# Patient Record
Sex: Male | Born: 1955 | Race: White | Hispanic: No | Marital: Married | Smoking: Never smoker
Health system: Southern US, Community
[De-identification: ages and names within clinical notes are randomized; demographics above are authoritative.]

## PROBLEM LIST (undated history)

## (undated) DIAGNOSIS — E785 Hyperlipidemia, unspecified: Secondary | ICD-10-CM

## (undated) DIAGNOSIS — K219 Gastro-esophageal reflux disease without esophagitis: Secondary | ICD-10-CM

## (undated) DIAGNOSIS — A048 Other specified bacterial intestinal infections: Secondary | ICD-10-CM

## (undated) DIAGNOSIS — J45909 Unspecified asthma, uncomplicated: Secondary | ICD-10-CM

## (undated) DIAGNOSIS — Z86018 Personal history of other benign neoplasm: Secondary | ICD-10-CM

## (undated) HISTORY — DX: Gastro-esophageal reflux disease without esophagitis: K21.9

## (undated) HISTORY — PX: SHOULDER SURGERY: SHX246

## (undated) HISTORY — PX: UMBILICAL HERNIA REPAIR: SHX196

## (undated) HISTORY — DX: Hyperlipidemia, unspecified: E78.5

## (undated) HISTORY — DX: Unspecified asthma, uncomplicated: J45.909

## (undated) HISTORY — DX: Other specified bacterial intestinal infections: A04.8

## (undated) HISTORY — PX: KNEE CARTILAGE SURGERY: SHX688

---
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
## 2002-11-15 ENCOUNTER — Ambulatory Visit (HOSPITAL_COMMUNITY): Admission: RE | Admit: 2002-11-15 | Discharge: 2002-11-15 | Payer: Self-pay | Admitting: Gastroenterology

## 2004-09-19 ENCOUNTER — Ambulatory Visit: Payer: Self-pay | Admitting: Specialist

## 2006-01-16 ENCOUNTER — Ambulatory Visit: Payer: Self-pay | Admitting: Specialist

## 2006-02-18 ENCOUNTER — Ambulatory Visit: Payer: Self-pay | Admitting: Specialist

## 2007-04-30 ENCOUNTER — Ambulatory Visit: Payer: Self-pay | Admitting: Surgery

## 2009-03-23 DIAGNOSIS — D239 Other benign neoplasm of skin, unspecified: Secondary | ICD-10-CM

## 2009-03-23 HISTORY — DX: Other benign neoplasm of skin, unspecified: D23.9

## 2014-05-17 ENCOUNTER — Emergency Department: Payer: Self-pay | Admitting: Emergency Medicine

## 2014-05-17 LAB — BASIC METABOLIC PANEL
Anion Gap: 9 (ref 7–16)
BUN: 20 mg/dL — AB (ref 7–18)
Calcium, Total: 8.5 mg/dL (ref 8.5–10.1)
Chloride: 107 mmol/L (ref 98–107)
Co2: 23 mmol/L (ref 21–32)
Creatinine: 0.98 mg/dL (ref 0.60–1.30)
EGFR (Non-African Amer.): >60
Glucose: 93 mg/dL (ref 65–99)
Osmolality: 280 (ref 275–301)
Potassium: 5 mmol/L (ref 3.5–5.1)
Sodium: 139 mmol/L (ref 136–145)

## 2014-05-17 LAB — TROPONIN I: Troponin-I: <0.02 ng/mL

## 2014-05-18 LAB — CBC
HCT: 42.3 % (ref 40.0–52.0)
HGB: 14.2 g/dL (ref 13.0–18.0)
MCH: 30.1 pg (ref 26.0–34.0)
MCHC: 33.6 g/dL (ref 32.0–36.0)
MCV: 90 fL (ref 80–100)
Platelet: 164 10*3/uL (ref 150–440)
RBC: 4.72 10*6/uL (ref 4.40–5.90)
RDW: 12.8 % (ref 11.5–14.5)
WBC: 7.9 10*3/uL (ref 3.8–10.6)

## 2014-05-18 LAB — TROPONIN I

## 2014-05-18 LAB — D-DIMER(ARMC): D-Dimer: 389 ng/ml

## 2014-05-25 ENCOUNTER — Ambulatory Visit: Payer: Self-pay | Admitting: Cardiovascular Disease

## 2014-06-27 ENCOUNTER — Encounter: Payer: Self-pay | Admitting: *Deleted

## 2014-06-28 ENCOUNTER — Ambulatory Visit (INDEPENDENT_AMBULATORY_CARE_PROVIDER_SITE_OTHER): Payer: BC Managed Care – PPO | Admitting: Cardiovascular Disease

## 2014-06-28 ENCOUNTER — Encounter (INDEPENDENT_AMBULATORY_CARE_PROVIDER_SITE_OTHER): Payer: Self-pay

## 2014-06-28 ENCOUNTER — Encounter: Payer: Self-pay | Admitting: Cardiovascular Disease

## 2014-06-28 VITALS — BP 158/86 | HR 84 | Ht 70.0 in | Wt 218.8 lb

## 2014-06-28 DIAGNOSIS — E785 Hyperlipidemia, unspecified: Secondary | ICD-10-CM

## 2014-06-28 DIAGNOSIS — R079 Chest pain, unspecified: Secondary | ICD-10-CM

## 2014-06-28 NOTE — Patient Instructions (Addendum)
Your physician has requested that you have a stress echocardiogram. For further information please visit [email or web address]. Please follow instruction sheet as given. - you can eat a light meal  -wear comfortable cloths and walking shoes  - you can take your medications   Your physician recommends that you schedule a follow-up appointment in:  As needed with Dr. Fletcher Anon

## 2014-07-02 DIAGNOSIS — E785 Hyperlipidemia, unspecified: Secondary | ICD-10-CM | POA: Insufficient documentation

## 2014-07-02 DIAGNOSIS — R079 Chest pain, unspecified: Secondary | ICD-10-CM | POA: Insufficient documentation

## 2014-07-02 NOTE — Progress Notes (Signed)
Primary care physician: Dr. Maryland Pink  HPI  This is a pleasant 58 year old man who was referred from the emergency room at [facility] for evaluation of chest pain. He has no prior cardiac history. He has chronic medical conditions that include GERD and hyperlipidemia. He has no history of hypertension, diabetes or smoking. There is no family history of coronary artery disease. He has been having intermittent episodes of sharp left-sided chest discomfort which is usually worse with deep breath. Recently, he had an episode that lasted for about an hour while he was watching TV. There was no substernal chest tightness. He reports no exercise induced symptoms. He exercises at least twice weekly with no reported symptoms of chest discomfort or shortness of breath. Evaluation in the emergency room was overall unremarkable including ECG and labs. D-dimer was normal. Chest x-ray shows no acute abnormalities.  Allergies  Allergen Reactions  . Codeine      Current Outpatient Prescriptions on File Prior to Visit  Medication Sig Dispense Refill  . ergocalciferol (VITAMIN D2) 50000 UNITS capsule Take 50,000 Units by mouth once a week.      Marland Kitchen omeprazole (PRILOSEC) 20 MG capsule Take 20 mg by mouth daily.       No current facility-administered medications on file prior to visit.     Past Medical History  Diagnosis Date  . Esophageal reflux   . Other and unspecified hyperlipidemia   . GERD (gastroesophageal reflux disease)   . Positive H. pylori test   . Asthma      Past Surgical History  Procedure Laterality Date  . Knee cartilage surgery Left   . Shoulder surgery    . Umbilical hernia repair       Family History  Problem Relation Age of Onset  . Family history unknown: Yes     History   Social History  . Marital Status: Married    Spouse Name: N/A    Number of Children: N/A  . Years of Education: N/A   Occupational History  . Not on file.   Social History Main Topics  .  Smoking status: Never Smoker   . Smokeless tobacco: Not on file  . Alcohol Use: No  . Drug Use: No  . Sexual Activity: Not on file   Other Topics Concern  . Not on file   Social History Narrative  . No narrative on file     ROS A 10 point review of system was performed. It is negative other than that mentioned in the history of present illness.   PHYSICAL EXAM   BP 158/86  Pulse 84  Ht 5\' 10"  (1.778 m)  Wt 218 lb 12.8 oz (99.247 kg)  BMI 31.39 kg/m2 Constitutional: He is oriented to person, place, and time. He appears well-developed and well-nourished. No distress.  HENT: No nasal discharge.  Head: Normocephalic and atraumatic.  Eyes: Pupils are equal and round.  No discharge. Neck: Normal range of motion. Neck supple. No JVD present. No thyromegaly present.  Cardiovascular: Normal rate, regular rhythm, normal heart sounds. Exam reveals no gallop and no friction rub. No murmur heard.  Pulmonary/Chest: Effort normal and breath sounds normal. No stridor. No respiratory distress. He has no wheezes. He has no rales. He exhibits no tenderness.  Abdominal: Soft. Bowel sounds are normal. He exhibits no distension. There is no tenderness. There is no rebound and no guarding.  Musculoskeletal: Normal range of motion. He exhibits no edema and no tenderness.  Neurological: He is alert  and oriented to person, place, and time. Coordination normal.  Skin: Skin is warm and dry. No rash noted. He is not diaphoretic. No erythema. No pallor.  Psychiatric: He has a normal mood and affect. His behavior is normal. Judgment and thought content normal.       DHW:YSHUO  Rhythm  WITHIN NORMAL LIMITS   ASSESSMENT AND PLAN

## 2014-07-02 NOTE — Assessment & Plan Note (Signed)
Recent lipid profile showed a total cholesterol of 224, triglyceride of 158, HDL of 38 and LDL of 158. I discussed with him the importance of lifestyle changes including diet and exercise. If LDL remains above 130, statin treatment should be considered.

## 2014-07-02 NOTE — Assessment & Plan Note (Signed)
The chest pain is overall atypical and seems pleuritic in nature. He does have some risk factors for coronary artery disease and thus I recommend evaluation with a stress echocardiogram. I discussed with the patient the importance of lifestyle changes in order to decrease the chance of future coronary artery disease and cardiovascular events. We discussed the importance of controlling risk factors, healthy diet as well as regular exercise. I also explained to him that a normal stress test does not rule out atherosclerosis or the possibility of future myocardial infarction.   If he continues to have symptoms in spite of negative cardiac workup, further noncardiac evaluation might be needed. I advised him to followup with his PCP.

## 2014-07-14 ENCOUNTER — Other Ambulatory Visit: Payer: BC Managed Care – PPO

## 2014-12-30 ENCOUNTER — Encounter: Payer: Self-pay | Admitting: Podiatry

## 2014-12-30 ENCOUNTER — Ambulatory Visit (INDEPENDENT_AMBULATORY_CARE_PROVIDER_SITE_OTHER): Payer: BLUE CROSS/BLUE SHIELD | Admitting: Podiatry

## 2014-12-30 ENCOUNTER — Ambulatory Visit (INDEPENDENT_AMBULATORY_CARE_PROVIDER_SITE_OTHER): Payer: BLUE CROSS/BLUE SHIELD

## 2014-12-30 VITALS — BP 158/87 | HR 75 | Resp 18 | Ht 70.0 in | Wt 205.0 lb

## 2014-12-30 DIAGNOSIS — B351 Tinea unguium: Secondary | ICD-10-CM

## 2014-12-30 DIAGNOSIS — M779 Enthesopathy, unspecified: Secondary | ICD-10-CM

## 2014-12-30 MED ORDER — TRIAMCINOLONE ACETONIDE 10 MG/ML IJ SUSP
10.0000 mg | Freq: Once | INTRAMUSCULAR | Status: AC
  Administered 2014-12-30: 10 mg

## 2014-12-30 NOTE — Progress Notes (Signed)
   Subjective:    Patient ID: Willie Lopez, male    DOB: Jun 09, 1956, 59 y.o.   MRN: [id number] Pt comes in today with the complaint that his bottom of his left foot has been hurting and has concerns about a fungus on his right big toe. He has not had any other treatment prior to coming to see [place].  HPI    Review of Systems  HENT: Positive for sinus pressure.   Eyes: Positive for itching.  Musculoskeletal: Positive for back pain.  All other systems reviewed and are negative.      Objective:   Physical Exam        Assessment & Plan:

## 2014-12-30 NOTE — Progress Notes (Signed)
Subjective:     Patient ID: Willie Lopez, male   DOB: November 03, 1955, 59 y.o.   MRN: [id number]  HPI patient presents stating I'm getting a lot of pain in my forefoot left for about 4 months and then my right big toenail has discolored on the medial side. States she wore some different shoes and that's when it started   Review of Systems  All other systems reviewed and are negative.      Objective:   Physical Exam  Constitutional: He is oriented to person, place, and time.  Cardiovascular: Intact distal pulses.   Musculoskeletal: Normal range of motion.  Neurological: He is oriented to person, place, and time.  Skin: Skin is warm.  Nursing note and vitals reviewed.  neurovascular status intact with muscle strength adequate and range of motion subtalar and midtarsal joint within normal limits. Patient's noted to have good digital perfusion and is well oriented 3 and is noted to have inflammation and fluid around the second metatarsophalangeal joint left and discoloration of the medial side of the right hallux nail in the medial one third of the bed. Patient is walking with an abnormal gait due to left foot pain     Assessment:     Inflammatory capsulitis left second MPJ with moderate depression of the arch is complicating factor and mycotic nail infection right hallux with probable trauma    Plan:     H&P and conditions reviewed with patient. At this point I did do a proximal nerve block left and after appropriate numbness I was able to aspirate the joint get out a small amount of clear fluid and injected with a quarter cc of dexamethasone Kenalog and applied thick padding to reduce stress on the joint. We will begin formula 3 for the right nail bed and will be seen back in 1 week for probable orthotic treatment

## 2015-01-06 ENCOUNTER — Encounter: Payer: Self-pay | Admitting: Podiatry

## 2015-01-06 ENCOUNTER — Ambulatory Visit (INDEPENDENT_AMBULATORY_CARE_PROVIDER_SITE_OTHER): Payer: BLUE CROSS/BLUE SHIELD | Admitting: Podiatry

## 2015-01-06 VITALS — BP 135/92 | HR 95 | Resp 18 | Ht 70.0 in | Wt 205.0 lb

## 2015-01-06 DIAGNOSIS — M779 Enthesopathy, unspecified: Secondary | ICD-10-CM

## 2015-01-08 NOTE — Progress Notes (Signed)
Subjective:     Patient ID: Willie Lopez, male   DOB: 12-12-1955, 59 y.o.   MRN: [id number]  HPI patient states that he is improved but continues to have discomfort with ambulation and knows there is been some slight movement of the second toe in a dorsal and medial direction   Review of Systems     Objective:   Physical Exam Her vascular status intact muscle strength adequate and noted with continued discomfort and pain when I pressed into the second metatarsal phalangeal joint with mild medial and dorsal dislocation of the toe    Assessment:     Inflammatory capsulitis second MPJ left with pain    Plan:     Reviewed that it has improved but I'm concerned long-term about the mechanical implications of the joint. At this time I dispensed further padding with instructions on usage and scanned for custom orthotics to reduce stress against the joint. Reappoint for [place] to recheck

## 2015-01-27 ENCOUNTER — Ambulatory Visit (INDEPENDENT_AMBULATORY_CARE_PROVIDER_SITE_OTHER): Payer: BLUE CROSS/BLUE SHIELD | Admitting: Podiatry

## 2015-01-27 DIAGNOSIS — M779 Enthesopathy, unspecified: Secondary | ICD-10-CM

## 2015-01-27 NOTE — Patient Instructions (Signed)

## 2015-01-27 NOTE — Progress Notes (Signed)
Patient presents to pick up orthotics. Instructions were reviewed.

## 2015-09-06 ENCOUNTER — Encounter: Payer: Self-pay | Admitting: Emergency Medicine

## 2015-09-06 ENCOUNTER — Emergency Department
Admission: EM | Admit: 2015-09-06 | Discharge: 2015-09-06 | Disposition: A | Discharge status: Home / Self Care | Payer: BLUE CROSS/BLUE SHIELD | Attending: Emergency Medicine | Admitting: Emergency Medicine

## 2015-09-06 DIAGNOSIS — Y998 Other external cause status: Secondary | ICD-10-CM | POA: Insufficient documentation

## 2015-09-06 DIAGNOSIS — Y92410 Unspecified street and highway as the place of occurrence of the external cause: Secondary | ICD-10-CM | POA: Insufficient documentation

## 2015-09-06 DIAGNOSIS — Z79899 Other long term (current) drug therapy: Secondary | ICD-10-CM | POA: Insufficient documentation

## 2015-09-06 DIAGNOSIS — M62838 Other muscle spasm: Secondary | ICD-10-CM | POA: Diagnosis not present

## 2015-09-06 DIAGNOSIS — S39012A Strain of muscle, fascia and tendon of lower back, initial encounter: Secondary | ICD-10-CM

## 2015-09-06 DIAGNOSIS — Y9389 Activity, other specified: Secondary | ICD-10-CM | POA: Insufficient documentation

## 2015-09-06 DIAGNOSIS — S199XXA Unspecified injury of neck, initial encounter: Secondary | ICD-10-CM | POA: Diagnosis present

## 2015-09-06 MED ORDER — MELOXICAM 15 MG PO TABS: 15.0000 mg | ORAL_TABLET | Freq: Every day | ORAL | Status: DC

## 2015-09-06 MED ORDER — KETOROLAC TROMETHAMINE 60 MG/2ML IM SOLN
60.0000 mg | Freq: Once | INTRAMUSCULAR | Status: AC
  Administered 2015-09-06: 60 mg via INTRAMUSCULAR
  Filled 2015-09-06: qty 2

## 2015-09-06 MED ORDER — DIAZEPAM 2 MG PO TABS: 2.0000 mg | ORAL_TABLET | Freq: Three times a day (TID) | ORAL | Status: DC | PRN
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
## 2015-11-06 ENCOUNTER — Other Ambulatory Visit: Payer: Self-pay | Admitting: Student

## 2015-11-06 DIAGNOSIS — M545 Low back pain: Secondary | ICD-10-CM

## 2015-11-07 ENCOUNTER — Ambulatory Visit: Payer: No Typology Code available for payment source | Attending: Student | Admitting: Physical Therapy

## 2015-11-07 DIAGNOSIS — M545 Low back pain: Secondary | ICD-10-CM | POA: Diagnosis not present

## 2015-11-07 DIAGNOSIS — G8929 Other chronic pain: Secondary | ICD-10-CM | POA: Diagnosis present
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
## 2015-11-07 NOTE — Patient Instructions (Signed)
All exercises provided were adapted from [email or web address]. Patient was provided a written handout with pictures as described. Any additional cues were manually entered in to handout and copied in to this document.  FOAM ROLL SPINE MASSAGE - STANDING  Stand with a foam roll behind your back. Slowly perform mini-squats and allow the foam roller to roll up and down your back for a self massage.   Prone quadriceps stretch with strap  1) Begin on your stomach with a strap (can use dog leash or towel) around your ankle 2) While maintaining your pelvis flat on the ground, begin to pull the strap to bring your heel closer to your buttocks 3) Continue to pull until a mild-moderate stretch is felt in the front part of your thigh (quadriceps) 4) Repeat for allotted number of repetitions and perform going in the opposite direction   PRONE ON ELBOWS - POE  Lying face down, slowly press up and prop yourself up on your elbows.    ** DO NOT BE TOO AGGRESSIVE, stay within pain free range.

## 2015-11-14 ENCOUNTER — Ambulatory Visit: Payer: No Typology Code available for payment source | Admitting: Physical Therapy

## 2015-11-14 DIAGNOSIS — M545 Low back pain, unspecified: Secondary | ICD-10-CM

## 2015-11-14 DIAGNOSIS — G8929 Other chronic pain: Secondary | ICD-10-CM
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
## 2015-11-16 ENCOUNTER — Ambulatory Visit: Payer: No Typology Code available for payment source | Admitting: Physical Therapy

## 2015-11-16 DIAGNOSIS — G8929 Other chronic pain: Secondary | ICD-10-CM

## 2015-11-16 DIAGNOSIS — M545 Low back pain, unspecified: Secondary | ICD-10-CM
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
## 2015-11-21 ENCOUNTER — Ambulatory Visit: Payer: No Typology Code available for payment source | Admitting: Physical Therapy

## 2015-11-21 DIAGNOSIS — M545 Low back pain: Secondary | ICD-10-CM | POA: Diagnosis not present

## 2015-11-21 DIAGNOSIS — G8929 Other chronic pain: Secondary | ICD-10-CM
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
## 2015-11-22 ENCOUNTER — Ambulatory Visit
Admission: RE | Admit: 2015-11-22 | Discharge: 2015-11-22 | Disposition: A | Discharge status: Home / Self Care | Payer: No Typology Code available for payment source | Source: Ambulatory Visit | Attending: Student | Admitting: Student

## 2015-11-22 DIAGNOSIS — M4806 Spinal stenosis, lumbar region: Secondary | ICD-10-CM | POA: Diagnosis not present

## 2015-11-22 DIAGNOSIS — M5126 Other intervertebral disc displacement, lumbar region: Secondary | ICD-10-CM | POA: Insufficient documentation

## 2015-11-22 DIAGNOSIS — M545 Low back pain: Secondary | ICD-10-CM | POA: Diagnosis not present

## 2015-11-22 DIAGNOSIS — M4686 Other specified inflammatory spondylopathies, lumbar region: Secondary | ICD-10-CM | POA: Diagnosis not present

## 2015-11-23 ENCOUNTER — Ambulatory Visit: Payer: No Typology Code available for payment source | Admitting: Physical Therapy

## 2015-11-23 DIAGNOSIS — M545 Low back pain: Principal | ICD-10-CM

## 2015-11-23 DIAGNOSIS — G8929 Other chronic pain: Secondary | ICD-10-CM
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
## 2015-12-07 ENCOUNTER — Ambulatory Visit: Payer: No Typology Code available for payment source | Attending: Student | Admitting: Physical Therapy

## 2015-12-07 DIAGNOSIS — M545 Low back pain: Secondary | ICD-10-CM | POA: Diagnosis present

## 2015-12-07 DIAGNOSIS — G8929 Other chronic pain: Secondary | ICD-10-CM | POA: Diagnosis present
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
## 2018-05-28 DIAGNOSIS — K219 Gastro-esophageal reflux disease without esophagitis: Secondary | ICD-10-CM | POA: Insufficient documentation

## 2018-05-29 ENCOUNTER — Ambulatory Visit (INDEPENDENT_AMBULATORY_CARE_PROVIDER_SITE_OTHER): Payer: 59 | Admitting: Podiatry

## 2018-05-29 ENCOUNTER — Encounter: Payer: Self-pay | Admitting: Podiatry

## 2018-05-29 ENCOUNTER — Ambulatory Visit (INDEPENDENT_AMBULATORY_CARE_PROVIDER_SITE_OTHER): Payer: 59

## 2018-05-29 DIAGNOSIS — M2011 Hallux valgus (acquired), right foot: Secondary | ICD-10-CM

## 2018-05-29 DIAGNOSIS — Z79899 Other long term (current) drug therapy: Secondary | ICD-10-CM

## 2018-05-29 DIAGNOSIS — B351 Tinea unguium: Secondary | ICD-10-CM | POA: Diagnosis not present

## 2018-05-29 MED ORDER — TERBINAFINE HCL 250 MG PO TABS: 250.0000 mg | ORAL_TABLET | Freq: Every day | ORAL | 0 refills | Status: DC

## 2018-05-30 LAB — HEPATIC FUNCTION PANEL
ALT: 21 [IU]/L (ref 0–44)
AST: 14 [IU]/L (ref 0–40)
Albumin: 3.9 g/dL (ref 3.6–4.8)
Alkaline Phosphatase: 80 [IU]/L (ref 39–117)
Bilirubin Total: 0.5 mg/dL (ref 0.0–1.2)
Bilirubin, Direct: 0.1 mg/dL (ref 0.00–0.40)
Total Protein: 6.2 g/dL (ref 6.0–8.5)

## 2018-05-30 NOTE — Progress Notes (Signed)
   Subjective: 62 year old male presenting today with a chief complaint of aching pain secondary to a bunion on the right foot that has been present for the past 8 months. Walking and wearing certain shoes increases the pain. He also reports fungus of the right great toenail that comes and goes for the past few years. He has used Formula 3 in the past with some relief. Patient is here for further evaluation and treatment.    Past Medical History:  Diagnosis Date  . Asthma   . Esophageal reflux   . GERD (gastroesophageal reflux disease)   . Other and unspecified hyperlipidemia   . Positive H. pylori test       Objective: Physical Exam General: The patient is alert and oriented x3 in no acute distress.  Dermatology: Hyperkeratotic, discolored, thickened, onychodystrophy of the right great toenail. Skin is cool, dry and supple bilateral lower extremities. Negative for open lesions or macerations.  Vascular: Palpable pedal pulses bilaterally. No edema or erythema noted. Capillary refill within normal limits.  Neurological: Epicritic and protective threshold grossly intact bilaterally.   Musculoskeletal Exam: Clinical evidence of bunion deformity noted to the respective foot. There is a moderate pain on palpation range of motion of the first MPJ. Lateral deviation of the hallux noted consistent with hallux abductovalgus.  Radiographic Exam: Increased intermetatarsal angle greater than 15 with a hallux abductus angle greater than 30 noted on AP view. Moderate degenerative changes noted within the first MPJ.  Assessment: 1. HAV w/ bunion deformity right 2. Onychomycosis right great toenail     Plan of Care:  1. Patient was evaluated. X-Rays reviewed. 2. Today we discussed the conservative versus surgical management of bunion deformity. 3. Patient opts for conservative treatment at the moment.  4. Prescription for Lamisil 250 mg #90 provided to patient.  5. Orders for liver function  test placed.  6. Return to clinic as needed.    Edrick Kins, DPM [facility]  Dr. Edrick Kins, [place]                                        [place], [place] [address]                Office [phone]  Fax [phone]

## 2019-03-08 DIAGNOSIS — Z86018 Personal history of other benign neoplasm: Secondary | ICD-10-CM

## 2019-12-14 ENCOUNTER — Ambulatory Visit (INDEPENDENT_AMBULATORY_CARE_PROVIDER_SITE_OTHER): Payer: Self-pay

## 2019-12-14 ENCOUNTER — Other Ambulatory Visit: Payer: Self-pay

## 2019-12-14 DIAGNOSIS — L719 Rosacea, unspecified: Secondary | ICD-10-CM

## 2019-12-14 DIAGNOSIS — I781 Nevus, non-neoplastic: Secondary | ICD-10-CM

## 2019-12-14 DIAGNOSIS — L711 Rhinophyma: Secondary | ICD-10-CM

## 2020-01-18 ENCOUNTER — Other Ambulatory Visit: Payer: Self-pay

## 2020-01-18 ENCOUNTER — Ambulatory Visit (INDEPENDENT_AMBULATORY_CARE_PROVIDER_SITE_OTHER): Payer: Self-pay

## 2020-01-18 DIAGNOSIS — I781 Nevus, non-neoplastic: Secondary | ICD-10-CM

## 2020-01-18 DIAGNOSIS — L719 Rosacea, unspecified: Secondary | ICD-10-CM

## 2020-02-29 ENCOUNTER — Other Ambulatory Visit: Payer: Self-pay

## 2020-02-29 ENCOUNTER — Ambulatory Visit (INDEPENDENT_AMBULATORY_CARE_PROVIDER_SITE_OTHER): Payer: Self-pay

## 2020-02-29 DIAGNOSIS — I781 Nevus, non-neoplastic: Secondary | ICD-10-CM

## 2020-02-29 DIAGNOSIS — L719 Rosacea, unspecified: Secondary | ICD-10-CM

## 2020-02-29 NOTE — Progress Notes (Signed)
Nice improvement. We may do a maintenance tx in the fall if needed. Samples of Rhofade given. jj

## 2020-03-28 ENCOUNTER — Ambulatory Visit: Payer: Medicare PPO | Admitting: Dermatology

## 2020-04-27 ENCOUNTER — Ambulatory Visit: Payer: Medicare PPO | Admitting: Dermatology

## 2020-04-27 ENCOUNTER — Other Ambulatory Visit: Payer: Self-pay

## 2020-04-27 DIAGNOSIS — L578 Other skin changes due to chronic exposure to nonionizing radiation: Secondary | ICD-10-CM

## 2020-04-27 DIAGNOSIS — L738 Other specified follicular disorders: Secondary | ICD-10-CM

## 2020-04-27 DIAGNOSIS — L720 Epidermal cyst: Secondary | ICD-10-CM | POA: Diagnosis not present

## 2020-04-27 DIAGNOSIS — L820 Inflamed seborrheic keratosis: Secondary | ICD-10-CM

## 2020-04-27 NOTE — Progress Notes (Signed)
   Follow-Up Visit   Subjective  Willie Lopez is a 64 y.o. male who presents for the following: Lesion.  Patient here today for a spot that came up since February at bottom lip. It does get irritated. He also has a few spots on his face he would like checked.   The following portions of the chart were reviewed this encounter and updated as appropriate:  Tobacco  Allergies  Meds  Problems  Med Hx  Surg Hx  Fam Hx     Review of Systems:  No other skin or systemic complaints except as noted in HPI or Assessment and Plan.  Objective  Well appearing patient in no apparent distress; mood and affect are within normal limits.  A focused examination was performed including face. Relevant physical exam findings are noted in the Assessment and Plan.  Objective  Forehead, left cheek x 18 (18): Erythematous keratotic or waxy stuck-on papule or plaque.   Objective  Mid Lower Vermilion Lip: Smooth white papule(s).   Objective  Forehead (2): Yellow papules   Assessment & Plan    Inflamed seborrheic keratosis (18) Forehead, left cheek x 18  Destruction of lesion - Forehead, left cheek x 18 Complexity: simple   Destruction method: cryotherapy   Informed consent: discussed and consent obtained   Timeout:  patient name, date of birth, surgical site, and procedure verified Lesion destroyed using liquid nitrogen: Yes   Region frozen until ice ball extended beyond lesion: Yes   Outcome: patient tolerated procedure well with no complications   Post-procedure details: wound care instructions given    Milia Mid Lower Vermilion Lip Benign, observe.  Cont Aklief or Altreno QHS as tolerated. If not improving, consider extraction.  Samples of Altreno given today.  Sebaceous hyperplasia (2) Forehead Benign, observe.  2 areas treated with electrodesiccation destruction on the left glabellar area.  This is a cosmetic treatment.  $60 for first spot and $15 each additional.  Total  2 spots  treated for total $75 ED x 2  Actinic Damage - diffuse scaly erythematous macules with underlying dyspigmentation - Recommend daily broad spectrum sunscreen SPF 30+ to sun-exposed areas, reapply every 2 hours as needed.  - Call for new or changing lesions.  Return for as scheduled.  Graciella Belton, RMA, am acting as scribe for Sarina Ser, MD . Documentation: I have reviewed the above documentation for accuracy and completeness, and I agree with the above.  Sarina Ser, MD

## 2020-04-27 NOTE — Patient Instructions (Signed)
Cryotherapy Aftercare  . Wash gently with soap and water everyday.   . Apply Vaseline and Band-Aid daily until healed. Recommend daily broad spectrum sunscreen SPF 30+ to sun-exposed areas, reapply every 2 hours as needed. Call for new or changing lesions.  

## 2020-04-28 ENCOUNTER — Encounter: Payer: Self-pay | Admitting: Dermatology

## 2020-05-02 ENCOUNTER — Encounter: Payer: Medicare PPO | Admitting: Dermatology

## 2020-11-27 ENCOUNTER — Other Ambulatory Visit: Payer: Self-pay

## 2020-11-27 ENCOUNTER — Ambulatory Visit: Payer: Medicare PPO | Admitting: Dermatology

## 2020-11-27 DIAGNOSIS — L720 Epidermal cyst: Secondary | ICD-10-CM

## 2020-11-27 DIAGNOSIS — L719 Rosacea, unspecified: Secondary | ICD-10-CM

## 2020-11-27 DIAGNOSIS — L738 Other specified follicular disorders: Secondary | ICD-10-CM | POA: Diagnosis not present

## 2020-11-27 DIAGNOSIS — D1800 Hemangioma unspecified site: Secondary | ICD-10-CM

## 2020-11-27 DIAGNOSIS — B353 Tinea pedis: Secondary | ICD-10-CM

## 2020-11-27 DIAGNOSIS — L814 Other melanin hyperpigmentation: Secondary | ICD-10-CM

## 2020-11-27 DIAGNOSIS — L603 Nail dystrophy: Secondary | ICD-10-CM | POA: Diagnosis not present

## 2020-11-27 DIAGNOSIS — D229 Melanocytic nevi, unspecified: Secondary | ICD-10-CM

## 2020-11-27 DIAGNOSIS — L821 Other seborrheic keratosis: Secondary | ICD-10-CM

## 2020-11-27 DIAGNOSIS — L578 Other skin changes due to chronic exposure to nonionizing radiation: Secondary | ICD-10-CM

## 2020-11-27 DIAGNOSIS — Z1283 Encounter for screening for malignant neoplasm of skin: Secondary | ICD-10-CM

## 2020-11-27 NOTE — Progress Notes (Signed)
   Follow-Up Visit   Subjective  Willie Lopez is a 65 y.o. male who presents for the following: TBSE (Patient here today for tbse. He reports issues with big toe right foot. He denies other concerns at this time. ). The patient presents for Total-Body Skin Exam (TBSE) for skin cancer screening and mole check.  The following portions of the chart were reviewed this encounter and updated as appropriate:  Tobacco  Allergies  Meds  Problems  Med Hx  Surg Hx  Fam Hx      Objective  Well appearing patient in no apparent distress; mood and affect are within normal limits.  A full examination was performed including scalp, head, eyes, ears, nose, lips, neck, chest, axillae, abdomen, back, buttocks, bilateral upper extremities, bilateral lower extremities, hands, feet, fingers, toes, fingernails, and toenails. All findings within normal limits unless otherwise noted below.  Objective  Great toe of right foot: Clear   Objective  nose and cheeks: Mid face erythema with telangiectasias +/- scattered inflammatory papules.   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  Halo may decrease thickness of nasal skin  Rosacea cream sent through [facility] use only at bedtime on nose  [facility]  Azelaic Acid 15% Ivermectin 1% Metronidazole 1%   Recommend elta moisturizer sun screen - apply in morning   Rosacea is a chronic progressive skin condition usually affecting the face of adults, causing redness and/or acne bumps. It is treatable but not curable. It sometimes affects the eyes (ocular rosacea) as well. It may respond to topical and/or systemic medication and can flare with stress, sun exposure, alcohol, exercise and some foods.  Daily application of broad  spectrum spf 30+ sunscreen to face is recommended to reduce flares.  Sebaceous Hyperplasia forehead - Small yellow papules with a central dell - Benign - Observe  Milia   mid lower vermillion lip  - tiny firm white papules - type of cyst - benign - may be extracted if symptomatic - observe  Lentigines - Scattered tan macules - Due to sun exposure - Benign-appering, observe - Recommend daily broad spectrum sunscreen SPF 30+ to sun-exposed areas, reapply every 2 hours as needed. - Call for any changes  Seborrheic Keratoses - Stuck-on, waxy, tan-brown papules and plaques  - Discussed benign etiology and prognosis. - Observe - Call for any changes  Melanocytic Nevi - Tan-brown and/or pink-flesh-colored symmetric macules and papules - Benign appearing on exam today - Observation - Call clinic for new or changing moles - Recommend daily use of broad spectrum spf 30+ sunscreen to sun-exposed areas.   Hemangiomas - Red papules - Discussed benign nature - Observe - Call for any changes  Actinic Damage - Chronic, secondary to cumulative UV/sun exposure - diffuse scaly erythematous macules with underlying dyspigmentation - Recommend daily broad spectrum sunscreen SPF 30+ to sun-exposed areas, reapply every 2 hours as needed.  - Call for new or changing lesions.  Skin cancer screening performed today.  Return in about 1 year (around 11/27/2021), or tbse.  Willie Lopez, CMA, am acting as scribe for Willie Ser, MD.  Documentation: I have reviewed the above documentation for accuracy and completeness, and I agree with the above.  Willie Ser, MD

## 2020-11-27 NOTE — Patient Instructions (Addendum)
elta sunscreen moisturizer 30 spf -   Instructions for [facility] Medications  One or more of your medications was sent to the [facility] mail order compounding pharmacy. You will receive an email from them and can purchase the medicine through that link. It will then be mailed to your home at the address you confirmed. If for any reason you do not receive an email from them, please check your spam folder. If you still do not find the email, please let us know. [facility] phone number is [phone].

## 2020-11-28 ENCOUNTER — Encounter: Payer: Self-pay | Admitting: Dermatology

## 2021-04-25 ENCOUNTER — Other Ambulatory Visit (HOSPITAL_COMMUNITY): Payer: Self-pay | Admitting: Orthopedic Surgery

## 2021-04-25 ENCOUNTER — Other Ambulatory Visit: Payer: Self-pay | Admitting: Orthopedic Surgery

## 2021-04-25 DIAGNOSIS — M2391 Unspecified internal derangement of right knee: Secondary | ICD-10-CM

## 2021-04-25 DIAGNOSIS — G8929 Other chronic pain: Secondary | ICD-10-CM

## 2021-05-05 ENCOUNTER — Ambulatory Visit
Admission: RE | Admit: 2021-05-05 | Discharge: 2021-05-05 | Disposition: A | Discharge status: Home / Self Care | Payer: Medicare PPO | Source: Ambulatory Visit | Attending: Orthopedic Surgery | Admitting: Orthopedic Surgery

## 2021-05-05 DIAGNOSIS — M2391 Unspecified internal derangement of right knee: Secondary | ICD-10-CM | POA: Diagnosis present

## 2021-05-05 DIAGNOSIS — M25561 Pain in right knee: Secondary | ICD-10-CM | POA: Diagnosis present

## 2021-05-05 DIAGNOSIS — G8929 Other chronic pain: Secondary | ICD-10-CM

## 2021-11-29 ENCOUNTER — Other Ambulatory Visit: Payer: Self-pay

## 2021-11-29 ENCOUNTER — Ambulatory Visit: Payer: Medicare PPO | Admitting: Dermatology

## 2021-11-29 DIAGNOSIS — Z1283 Encounter for screening for malignant neoplasm of skin: Secondary | ICD-10-CM

## 2021-11-29 DIAGNOSIS — L711 Rhinophyma: Secondary | ICD-10-CM | POA: Diagnosis not present

## 2021-11-29 DIAGNOSIS — L814 Other melanin hyperpigmentation: Secondary | ICD-10-CM

## 2021-11-29 DIAGNOSIS — Z86018 Personal history of other benign neoplasm: Secondary | ICD-10-CM

## 2021-11-29 DIAGNOSIS — L719 Rosacea, unspecified: Secondary | ICD-10-CM | POA: Diagnosis not present

## 2021-11-29 DIAGNOSIS — D1800 Hemangioma unspecified site: Secondary | ICD-10-CM

## 2021-11-29 DIAGNOSIS — L578 Other skin changes due to chronic exposure to nonionizing radiation: Secondary | ICD-10-CM

## 2021-11-29 DIAGNOSIS — L820 Inflamed seborrheic keratosis: Secondary | ICD-10-CM | POA: Diagnosis not present

## 2021-11-29 DIAGNOSIS — L821 Other seborrheic keratosis: Secondary | ICD-10-CM

## 2021-11-29 DIAGNOSIS — D229 Melanocytic nevi, unspecified: Secondary | ICD-10-CM

## 2021-11-29 NOTE — Progress Notes (Signed)
? ?Follow-Up Visit ?  ?Subjective  ?Willie Lopez is a 66 y.o. male who presents for the following: Total body skin exam (Hx of multiple dysplastic nevi) and Rosacea (Face, SM Triple cream). ?The patient presents for Total-Body Skin Exam (TBSE) for skin cancer screening and mole check.  The patient has spots, moles and lesions to be evaluated, some may be new or changing and the patient has concerns that these could be cancer.  ? ?The following portions of the chart were reviewed this encounter and updated as appropriate:  ? Tobacco  Allergies  Meds  Problems  Med Hx  Surg Hx  Fam Hx   ?  ?Review of Systems:  No other skin or systemic complaints except as noted in HPI or Assessment and Plan. ? ?Objective  ?Well appearing patient in no apparent distress; mood and affect are within normal limits. ? ?A full examination was performed including scalp, head, eyes, ears, nose, lips, neck, chest, axillae, abdomen, back, buttocks, bilateral upper extremities, bilateral lower extremities, hands, feet, fingers, toes, fingernails, and toenails. All findings within normal limits unless otherwise noted below. ? ?Head - Anterior (Face) ?Erythema face with telangiectasias, thickening of skin on nose ? ?R temple x 1 ?Stuck on waxy paps with erythema ? ? ?Assessment & Plan  ? ?Lentigines ?- Scattered tan macules ?- Due to sun exposure ?- Benign-appearing, observe ?- Recommend daily broad spectrum sunscreen SPF 30+ to sun-exposed areas, reapply every 2 hours as needed. ?- Call for any changes ? ?Seborrheic Keratoses ?- Stuck-on, waxy, tan-brown papules and/or plaques  ?- Benign-appearing ?- Discussed benign etiology and prognosis. ?- Observe ?- Call for any changes ? ?Melanocytic Nevi ?- Tan-brown and/or pink-flesh-colored symmetric macules and papules ?- Benign appearing on exam today ?- Observation ?- Call clinic for new or changing moles ?- Recommend daily use of broad spectrum spf 30+ sunscreen to sun-exposed areas.   ? ?Hemangiomas ?- Red papules ?- Discussed benign nature ?- Observe ?- Call for any changes ? ?Actinic Damage ?- Chronic condition, secondary to cumulative UV/sun exposure ?- diffuse scaly erythematous macules with underlying dyspigmentation ?- Recommend daily broad spectrum sunscreen SPF 30+ to sun-exposed areas, reapply every 2 hours as needed.  ?- Staying in the shade or wearing long sleeves, sun glasses (UVA+UVB protection) and wide brim hats (4-inch brim around the entire circumference of the hat) are also recommended for sun protection.  ?- Call for new or changing lesions. ? ?Skin cancer screening performed today. ? ?History of Dysplastic Nevi ?- No evidence of recurrence today ?- Recommend regular full body skin exams ?- Recommend daily broad spectrum sunscreen SPF 30+ to sun-exposed areas, reapply every 2 hours as needed.  ?- Call if any new or changing lesions are noted between office visits  ?- multiple ? 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 ?Rosacea is a chronic progressive skin condition usually affecting the face of adults, causing redness and/or acne bumps. It is treatable but not curable. It sometimes affects the eyes (ocular rosacea) as well. It may respond to topical and/or systemic medication and can flare with stress, sun exposure, alcohol, exercise and some foods.  Daily application of broad spectrum spf 30+ sunscreen to face is recommended to reduce flares. ? ?Discussed the treatment option of BBL/laser.  Typically we recommend 1-3 treatment sessions about 5-8 weeks apart for best results.  The patient's condition may require "maintenance treatments" in the future.  The fee for BBL / laser treatments is $350 per  treatment session for the whole face.  A fee can be quoted for other parts of the body. ?Insurance typically does not pay for BBL/laser treatments and therefore the fee is an out-of-pocket cost. ? ?Cont  [facility] metronidazole/ivermectin/azelaic acid twice  daily as needed to affected areas on the face. The patient was advised this is not covered by insurance since it is made by a compounding pharmacy. They will receive an email to check out and the medication will be mailed to their home.   ? ?Inflamed seborrheic keratosis ?R temple x 1 ? ?Destruction of lesion - R temple x 1 ?Complexity: simple   ?Destruction method: cryotherapy   ?Informed consent: discussed and consent obtained   ?Timeout:  patient name, date of birth, surgical site, and procedure verified ?Lesion destroyed using liquid nitrogen: Yes   ?Region frozen until ice ball extended beyond lesion: Yes   ?Outcome: patient tolerated procedure well with no complications   ?Post-procedure details: wound care instructions given   ? ? ?Return in about 1 year (around 11/30/2022) for TBSE, Hx of Dysplastic nevi. ? ?I, Othelia Pulling, RMA, am acting as scribe for Sarina Ser, MD . ?Documentation: I have reviewed the above documentation for accuracy and completeness, and I agree with the above. ? ?Sarina Ser, MD ? ?

## 2021-11-29 NOTE — Patient Instructions (Signed)
Instructions for [facility] Medications ? ?One or more of your medications was sent to the [facility] mail order compounding pharmacy. You will receive an email from them and can purchase the medicine through that link. It will then be mailed to your home at the address you confirmed. If for any reason you do not receive an email from them, please check your spam folder. If you still do not find the email, please let us know. [facility] phone number is [phone].  ? ?If You Need Anything After Your Visit ? ?If you have any questions or concerns for your doctor, please call our main line at [phone] and press option 4 to reach your doctor's medical assistant. If no one answers, please leave a voicemail as directed and we will return your call as soon as possible. Messages left after 4 pm will be answered the following business day.  ? ?You may also send [place] a message via MyChart. We typically respond to MyChart messages within 1-2 business days. ? ?For prescription refills, please ask your pharmacy to contact our office. Our fax number is [phone]. ? ?If you have an urgent issue when the clinic is closed that cannot wait until the next business day, you can page your doctor at the number below.   ? ?Please note that while we do our best to be available for urgent issues outside of office hours, we are not available 24/7.  ? ?If you have an urgent issue and are unable to reach [place], you may choose to seek medical care at your doctor's office, retail clinic, urgent care center, or emergency room. ? ?If you have a medical emergency, please immediately call 911 or go to the emergency department. ? ?Pager Numbers ? ?- Dr. Nehemiah Massed: [phone] ? ?- Dr. Laurence Ferrari: [phone] ? ?- Dr. Nicole Kindred: [phone] ? ?In the event of inclement weather, please call our main line at [phone] for an update on the status of any delays or closures. ? ?Dermatology Medication Tips: ?Please keep the boxes that  topical medications come in in order to help keep track of the instructions about where and how to use these. Pharmacies typically print the medication instructions only on the boxes and not directly on the medication tubes.  ? ?If your medication is too expensive, please contact our office at [phone] option 4 or send [place] a message through [place].  ? ?We are unable to tell what your co-pay for medications will be in advance as this is different depending on your insurance coverage. However, we may be able to find a substitute medication at lower cost or fill out paperwork to get insurance to cover a needed medication.  ? ?If a prior authorization is required to get your medication covered by your insurance company, please allow [place] 1-2 business days to complete this process. ? ?Drug prices often vary depending on where the prescription is filled and some pharmacies may offer cheaper prices. ? ?The website www.goodrx.com contains coupons for medications through different pharmacies. The prices here do not account for what the cost may be with help from insurance (it may be cheaper with your insurance), but the website can give you the price if you did not use any insurance.  ?- You can print the associated coupon and take it with your prescription to the pharmacy.  ?- You may also stop by our office during regular business hours and pick up a GoodRx coupon card.  ?- If you need your prescription sent  electronically to a different pharmacy, notify our office through [facility] or by phone at [phone] option 4. ? ? ? ? ?Si Usted Necesita Algo Despu?s de Su Visita ? ?Tambi?n puede enviarnos un mensaje a trav?s de MyChart. Por lo general respondemos a los mensajes de MyChart en el transcurso de 1 a 2 d?as h?biles. ? ?Para renovar recetas, por favor pida a su farmacia que se ponga en contacto con nuestra oficina. Nuestro n?mero de fax es el [phone]. ? ?Si tiene un asunto urgente cuando la cl?nica  est? cerrada y que no puede esperar hasta el siguiente d?a h?bil, puede llamar/localizar a su doctor(a) al n?mero que aparece a continuaci?n.  ? ?Por favor, tenga en cuenta que aunque hacemos todo lo posible para estar disponibles para asuntos urgentes fuera del horario de oficina, no estamos disponibles las 24 horas del d?a, los 7 d?as de la semana.  ? ?Si tiene un problema urgente y no puede comunicarse con nosotros, puede optar por buscar atenci?n m?dica  en el consultorio de su doctor(a), en una cl?nica privada, en un centro de atenci?n urgente o en una sala de emergencias. ? ?Si tiene Engineer, maintenance (IT) m?dica, por favor llame inmediatamente al 911 o vaya a la sala de emergencias. ? ?N?meros de b?per ? ?- Dr. Nehemiah Massed: [phone] ? ?- Dra. Moye: [phone] ? ?- Dra. Nicole Kindred: [phone] ? ?En caso de inclemencias del tiempo, por favor llame a nuestra l?nea principal al [phone] para una actualizaci?n sobre el estado de cualquier retraso o cierre. ? ?Consejos para la medicaci?n en dermatolog?a: ?Por favor, guarde las cajas en las que vienen los medicamentos de uso t?pico para ayudarle a seguir las instrucciones sobre d?nde y c?mo usarlos. Las farmacias generalmente imprimen las instrucciones del medicamento s?lo en las cajas y no directamente en los tubos del [place].  ? ?Si su medicamento es muy caro, por favor, p?ngase en contacto con Zigmund Daniel llamando al [phone] y presione la opci?n 4 o env?enos un mensaje a trav?s de MyChart.  ? ?No podemos decirle cu?l ser? su copago por los medicamentos por adelantado ya que esto es diferente dependiendo de la cobertura de su seguro. Sin embargo, es posible que podamos encontrar un medicamento sustituto a Electrical engineer un formulario para que el seguro cubra el medicamento que se considera necesario.  ? ?Si se requiere Ardelia Mems autorizaci?n previa para que su compa??a de seguros [place] su medicamento, por favor perm?tanos de 1 a 2 d?as h?biles para  completar este proceso. ? ?Los precios de los medicamentos var?an con frecuencia dependiendo del Environmental consultant de d?nde se surte la receta y alguna farmacias pueden ofrecer precios m?s baratos. ? ?El sitio web www.goodrx.com tiene cupones para medicamentos de Airline pilot. Los precios aqu? no tienen en cuenta lo que podr?a costar con la ayuda del seguro (puede ser m?s barato con su seguro), pero el sitio web puede darle el precio si no utiliz? ning?n seguro.  ?- Puede imprimir el cup?n correspondiente y llevarlo con su receta a la farmacia.  ?- Tambi?n puede pasar por nuestra oficina durante el horario de atenci?n regular y recoger una tarjeta de cupones de GoodRx.  ?- Si necesita que su receta se env?e electr?nicamente a Chiropodist, informe a nuestra oficina a trav?s de MyChart de [place] o por tel?fono llamando al [phone] y presione la opci?n 4.  ?

## 2021-12-01 ENCOUNTER — Encounter: Payer: Self-pay | Admitting: Dermatology

## 2022-07-17 ENCOUNTER — Other Ambulatory Visit: Payer: Self-pay | Admitting: Family Medicine

## 2022-07-17 DIAGNOSIS — E785 Hyperlipidemia, unspecified: Secondary | ICD-10-CM

## 2022-07-26 ENCOUNTER — Ambulatory Visit
Admission: RE | Admit: 2022-07-26 | Discharge: 2022-07-26 | Disposition: A | Discharge status: Home / Self Care | Payer: Medicare PPO | Source: Ambulatory Visit | Attending: Family Medicine | Admitting: Family Medicine

## 2022-07-26 DIAGNOSIS — E785 Hyperlipidemia, unspecified: Secondary | ICD-10-CM

## 2022-12-02 ENCOUNTER — Ambulatory Visit: Payer: Medicare PPO | Admitting: Dermatology

## 2023-02-06 ENCOUNTER — Ambulatory Visit: Payer: Medicare HMO | Admitting: Dermatology

## 2023-02-06 ENCOUNTER — Ambulatory Visit: Payer: Medicare PPO | Admitting: Dermatology

## 2023-02-06 VITALS — BP 151/83 | HR 71

## 2023-02-06 DIAGNOSIS — L738 Other specified follicular disorders: Secondary | ICD-10-CM

## 2023-02-06 DIAGNOSIS — Z86018 Personal history of other benign neoplasm: Secondary | ICD-10-CM

## 2023-02-06 DIAGNOSIS — Z872 Personal history of diseases of the skin and subcutaneous tissue: Secondary | ICD-10-CM

## 2023-02-06 DIAGNOSIS — L711 Rhinophyma: Secondary | ICD-10-CM | POA: Diagnosis not present

## 2023-02-06 DIAGNOSIS — T148XXA Other injury of unspecified body region, initial encounter: Secondary | ICD-10-CM

## 2023-02-06 DIAGNOSIS — Z79899 Other long term (current) drug therapy: Secondary | ICD-10-CM

## 2023-02-06 DIAGNOSIS — L814 Other melanin hyperpigmentation: Secondary | ICD-10-CM

## 2023-02-06 DIAGNOSIS — L820 Inflamed seborrheic keratosis: Secondary | ICD-10-CM | POA: Diagnosis not present

## 2023-02-06 DIAGNOSIS — L821 Other seborrheic keratosis: Secondary | ICD-10-CM

## 2023-02-06 DIAGNOSIS — Z1283 Encounter for screening for malignant neoplasm of skin: Secondary | ICD-10-CM

## 2023-02-06 DIAGNOSIS — S60453A Superficial foreign body of left middle finger, initial encounter: Secondary | ICD-10-CM

## 2023-02-06 DIAGNOSIS — D1801 Hemangioma of skin and subcutaneous tissue: Secondary | ICD-10-CM

## 2023-02-06 DIAGNOSIS — D229 Melanocytic nevi, unspecified: Secondary | ICD-10-CM

## 2023-02-06 DIAGNOSIS — W908XXA Exposure to other nonionizing radiation, initial encounter: Secondary | ICD-10-CM

## 2023-02-06 DIAGNOSIS — L578 Other skin changes due to chronic exposure to nonionizing radiation: Secondary | ICD-10-CM

## 2023-02-06 DIAGNOSIS — X32XXXA Exposure to sunlight, initial encounter: Secondary | ICD-10-CM

## 2023-02-06 DIAGNOSIS — L719 Rosacea, unspecified: Secondary | ICD-10-CM

## 2023-02-06 NOTE — Patient Instructions (Addendum)
Instructions for [facility] Medications  One or more of your medications was sent to the [facility] mail order compounding pharmacy. You will receive an email from them and can purchase the medicine through that link. It will then be mailed to your home at the address you confirmed. If for any reason you do not receive an email from them, please check your spam folder. If you still do not find the email, please let us know. [facility] phone number is [phone].   Melanoma ABCDEs  Melanoma is the most dangerous type of skin cancer, and is the leading cause of death from skin disease.  You are more likely to develop melanoma if you: Have light-colored skin, light-colored eyes, or red or blond hair Spend a lot of time in the sun Tan regularly, either outdoors or in a tanning bed Have had blistering sunburns, especially during childhood Have a close family member who has had a melanoma Have atypical moles or large birthmarks  Early detection of melanoma is key since treatment is typically straightforward and cure rates are extremely high if we catch it early.   The first sign of melanoma is often a change in a mole or a new dark spot.  The ABCDE system is a way of remembering the signs of melanoma.  A for asymmetry:  The two halves do not match. B for border:  The edges of the growth are irregular. C for color:  A mixture of colors are present instead of an even brown color. D for diameter:  Melanomas are usually (but not always) greater than 6mm - the size of a pencil eraser. E for evolution:  The spot keeps changing in size, shape, and color.  Please check your skin once per month between visits. You can use a small mirror in front and a large mirror behind you to keep an eye on the back side or your body.   If you see any new or changing lesions before your next follow-up, please call to schedule a visit.  Please continue daily skin protection including broad spectrum  sunscreen SPF 30+ to sun-exposed areas, reapplying every 2 hours as needed when you're outdoors.   Staying in the shade or wearing long sleeves, sun glasses (UVA+UVB protection) and wide brim hats (4-inch brim around the entire circumference of the hat) are also recommended for sun protection.     Due to recent changes in healthcare laws, you may see results of your pathology and/or laboratory studies on MyChart before the doctors have had a chance to review them. We understand that in some cases there may be results that are confusing or concerning to you. Please understand that not all results are received at the same time and often the doctors may need to interpret multiple results in order to provide you with the best plan of care or course of treatment. Therefore, we ask that you please give us 2 business days to thoroughly review all your results before contacting the office for clarification. Should we see a critical lab result, you will be contacted sooner.   If You Need Anything After Your Visit  If you have any questions or concerns for your doctor, please call our main line at [phone] and press option 4 to reach your doctor's medical assistant. If no one answers, please leave a voicemail as directed and we will return your call as soon as possible. Messages left after 4 pm will be answered the following business day.     You may also send us a message via MyChart. We typically respond to MyChart messages within 1-2 business days.  For prescription refills, please ask your pharmacy to contact our office. Our fax number is [phone].  If you have an urgent issue when the clinic is closed that cannot wait until the next business day, you can page your doctor at the number below.    Please note that while we do our best to be available for urgent issues outside of office hours, we are not available 24/7.   If you have an urgent issue and are unable to reach us, you may choose to seek  medical care at your doctor's office, retail clinic, urgent care center, or emergency room.  If you have a medical emergency, please immediately call 911 or go to the emergency department.  Pager Numbers  - Dr. Kowalski: [phone]  - Dr. Moye: [phone]  - Dr. Stewart: [phone]  In the event of inclement weather, please call our main line at [phone] for an update on the status of any delays or closures.  Dermatology Medication Tips: Please keep the boxes that topical medications come in in order to help keep track of the instructions about where and how to use these. Pharmacies typically print the medication instructions only on the boxes and not directly on the medication tubes.   If your medication is too expensive, please contact our office at [phone] option 4 or send us a message through MyChart.   We are unable to tell what your co-pay for medications will be in advance as this is different depending on your insurance coverage. However, we may be able to find a substitute medication at lower cost or fill out paperwork to get insurance to cover a needed medication.   If a prior authorization is required to get your medication covered by your insurance company, please allow us 1-2 business days to complete this process.  Drug prices often vary depending on where the prescription is filled and some pharmacies may offer cheaper prices.  The website www.goodrx.com contains coupons for medications through different pharmacies. The prices here do not account for what the cost may be with help from insurance (it may be cheaper with your insurance), but the website can give you the price if you did not use any insurance.  - You can print the associated coupon and take it with your prescription to the pharmacy.  - You may also stop by our office during regular business hours and pick up a GoodRx coupon card.  - If you need your prescription sent electronically to a  different pharmacy, notify our office through [facility] MyChart or by phone at [phone] option 4.     Si Usted Necesita Algo Despus de Su Visita  Tambin puede enviarnos un mensaje a travs de MyChart. Por lo general respondemos a los mensajes de MyChart en el transcurso de 1 a 2 das hbiles.  Para renovar recetas, por favor pida a su farmacia que se ponga en contacto con nuestra oficina. Nuestro nmero de fax es el [phone].  Si tiene un asunto urgente cuando la clnica est cerrada y que no puede esperar hasta el siguiente da hbil, puede llamar/localizar a su doctor(a) al nmero que aparece a continuacin.   Por favor, tenga en cuenta que aunque hacemos todo lo posible para estar disponibles para asuntos urgentes fuera del horario de oficina, no estamos disponibles las 24 horas del da, los 7 das de la semana.     Si tiene un problema urgente y no puede comunicarse con nosotros, puede optar por buscar atencin mdica  en el consultorio de su doctor(a), en una clnica privada, en un centro de atencin urgente o en una sala de emergencias.  Si tiene una emergencia mdica, por favor llame inmediatamente al 911 o vaya a la sala de emergencias.  Nmeros de bper  - Dr. Kowalski: [phone]  - Dra. Moye: [phone]  - Dra. Stewart: [phone]  En caso de inclemencias del tiempo, por favor llame a nuestra lnea principal al [phone] para una actualizacin sobre el estado de cualquier retraso o cierre.  Consejos para la medicacin en dermatologa: Por favor, guarde las cajas en las que vienen los medicamentos de uso tpico para ayudarle a seguir las instrucciones sobre dnde y cmo usarlos. Las farmacias generalmente imprimen las instrucciones del medicamento slo en las cajas y no directamente en los tubos del medicamento.   Si su medicamento es muy caro, por favor, pngase en contacto con nuestra oficina llamando al [phone] y presione la opcin 4 o envenos un  mensaje a travs de MyChart.   No podemos decirle cul ser su copago por los medicamentos por adelantado ya que esto es diferente dependiendo de la cobertura de su seguro. Sin embargo, es posible que podamos encontrar un medicamento sustituto a menor costo o llenar un formulario para que el seguro cubra el medicamento que se considera necesario.   Si se requiere una autorizacin previa para que su compaa de seguros cubra su medicamento, por favor permtanos de 1 a 2 das hbiles para completar este proceso.  Los precios de los medicamentos varan con frecuencia dependiendo del lugar de dnde se surte la receta y alguna farmacias pueden ofrecer precios ms baratos.  El sitio web www.goodrx.com tiene cupones para medicamentos de diferentes farmacias. Los precios aqu no tienen en cuenta lo que podra costar con la ayuda del seguro (puede ser ms barato con su seguro), pero el sitio web puede darle el precio si no utiliz ningn seguro.  - Puede imprimir el cupn correspondiente y llevarlo con su receta a la farmacia.  - Tambin puede pasar por nuestra oficina durante el horario de atencin regular y recoger una tarjeta de cupones de GoodRx.  - Si necesita que su receta se enve electrnicamente a una farmacia diferente, informe a nuestra oficina a travs de MyChart de [place] o por telfono llamando al [phone] y presione la opcin 4.  

## 2023-02-06 NOTE — Progress Notes (Signed)
Follow-Up Visit   Subjective  Willie Lopez is a 67 y.o. male who presents for the following: Skin Cancer Screening and Full Body Skin Exam Spot at 2nd finger on left hand notice 8 month ago White spot at left lower leg Hx of rosacea    The patient presents for Total-Body Skin Exam (TBSE) for skin cancer screening and mole check. The patient has spots, moles and lesions to be evaluated, some may be new or changing and the patient has concerns that these could be cancer.    The following portions of the chart were reviewed this encounter and updated as appropriate: medications, allergies, medical history  Review of Systems:  No other skin or systemic complaints except as noted in HPI or Assessment and Plan.  Objective  Well appearing patient in no apparent distress; mood and affect are within normal limits.  A full examination was performed including scalp, head, eyes, ears, nose, lips, neck, chest, axillae, abdomen, back, buttocks, bilateral upper extremities, bilateral lower extremities, hands, feet, fingers, toes, fingernails, and toenails. All findings within normal limits unless otherwise noted below.   Relevant physical exam findings are noted in the Assessment and Plan.  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 It is treatable but not curable. It sometimes affects the eyes (ocular rosacea) as well. It may respond to topical and/or systemic medication and can flare with stress, sun exposure, alcohol, exercise and some foods.  Daily application of broad spectrum spf 30+ sunscreen to face is recommended to reduce flares.   Discussed the treatment option of BBL/laser.  Typically we recommend 1-3 treatment  sessions about 5-8 weeks apart for best results.  The patient's condition may require "maintenance treatments" in the future.  The fee for BBL / laser treatments is $350 per treatment session for the whole face.  A fee can be quoted for other parts of the body. Insurance typically does not pay for BBL/laser treatments and therefore the fee is an out-of-pocket cost.   Cont  [facility] metronidazole/ivermectin/azelaic acid twice daily as needed to affected areas on the face. The patient was advised this is not covered by insurance since it is made by a compounding pharmacy. They will receive an email to check out and the medication will be mailed to their home.    Instructions for [facility] Medications  One or more of your medications was sent to the [facility] mail order compounding pharmacy. You will receive an email from them and can purchase the medicine through that link. It will then be mailed to your home at the address you confirmed. If for any reason you do not receive an email from them, please check your spam folder. If you still do not find the email, please let us know. [facility] phone number is [phone].    LENTIGINES, SEBORRHEIC KERATOSES, HEMANGIOMAS - Benign normal skin lesions - Benign-appearing - Call for any changes  Sebaceous Hyperplasia forehead - Small yellow papules with a central dell - Benign - Observe  MELANOCYTIC NEVI - Tan-brown and/or pink-flesh-colored symmetric macules and papules - Benign appearing on exam today - Observation - Call clinic for new or changing moles - Recommend daily use of broad spectrum  spf 30+ sunscreen to sun-exposed areas.   ACTINIC DAMAGE - Chronic condition, secondary to cumulative UV/sun exposure - diffuse scaly erythematous macules with underlying dyspigmentation - Recommend daily broad spectrum sunscreen SPF 30+ to sun-exposed areas, reapply every 2 hours as needed.  - Staying in the shade or wearing  long sleeves, sun glasses (UVA+UVB protection) and wide brim hats (4-inch brim around the entire circumference of the hat) are also recommended for sun protection.  - Call for new or changing lesions.  HISTORY OF DYSPLASTIC NEVUS Multiple locations see history  No evidence of recurrence today Recommend regular full body skin exams Recommend daily broad spectrum sunscreen SPF 30+ to sun-exposed areas, reapply every 2 hours as needed.  Call if any new or changing lesions are noted between office visits  SKIN CANCER SCREENING PERFORMED TODAY.    Splinter Left Distal 3rd Finger  History of splinter last year  Discussed can take time to heal  Recommend give more time   Do not recommend treatment  Inflamed seborrheic keratosis left lower leg x 1  Symptomatic, irritating, patient would like treated.  Destruction of lesion - left lower leg x 1 Complexity: simple   Destruction method: cryotherapy   Informed consent: discussed and consent obtained   Timeout:  patient name, date of birth, surgical site, and procedure verified Lesion destroyed using liquid nitrogen: Yes   Region frozen until ice ball extended beyond lesion: Yes   Outcome: patient tolerated procedure well with no complications   Post-procedure details: wound care instructions given     Return in about 1 year (around 02/06/2024) for TBSE.  IAsher Muir, CMA, am acting as scribe for Armida Sans, MD.   Documentation: I have reviewed the above documentation for accuracy and completeness, and I agree with the above.  Armida Sans, MD

## 2023-02-07 ENCOUNTER — Encounter: Payer: Self-pay | Admitting: Dermatology

## 2023-07-11 ENCOUNTER — Ambulatory Visit: Payer: Medicare HMO | Admitting: Podiatry

## 2023-07-11 ENCOUNTER — Encounter: Payer: Self-pay | Admitting: Podiatry

## 2023-07-11 ENCOUNTER — Ambulatory Visit (INDEPENDENT_AMBULATORY_CARE_PROVIDER_SITE_OTHER): Payer: Medicare HMO

## 2023-07-11 VITALS — BP 183/95 | HR 68

## 2023-07-11 DIAGNOSIS — M722 Plantar fascial fibromatosis: Secondary | ICD-10-CM

## 2023-07-11 MED ORDER — MELOXICAM 15 MG PO TABS: 15.0000 mg | ORAL_TABLET | Freq: Every day | ORAL | 1 refills | Status: AC

## 2023-07-11 MED ORDER — BETAMETHASONE SOD PHOS & ACET 6 (3-3) MG/ML IJ SUSP
3.0000 mg | Freq: Once | INTRAMUSCULAR | Status: AC
Start: 2023-07-11 — End: 2023-07-11
  Administered 2023-07-11: 3 mg via INTRA_ARTICULAR

## 2023-07-11 NOTE — Progress Notes (Signed)
Chief Complaint  Patient presents with   Foot Pain    "My heel hurts." N - heel pain L - left plantar D - 2 mos O - suddenly, about the same C - sharp pain A - wallking, standing T - home remedies - stretches, taped it, bought shoe inserts   Nail Problem    "I would like him to reassess my toenail fungus and prescribe something."    Subjective: 67 y.o. male presenting today for onset of left heel pain ongoing for few months now.  Idiopathic onset.  He has been wearing Brooks running shoes as well as Dr. Margart Sickles insoles with some alleviation of his symptoms.  He has not done anything else for treatment  Past Medical History:  Diagnosis Date   Asthma    Dysplastic nevus 03/23/2009   Upper back central. Severe atypia, bordering on early evolving MIS. Excised: 05/19/2009, margins free.    Dysplastic nevus 10/18/2009   Left upper back. Moderate atypia, extends to one edge.    Dysplastic nevus 02/16/2014   Left buttock. Moderate atypia, lateral margin involved. Recurrent: 08/05/2014.    Dysplastic nevus 08/17/2015   Left infrapectoral. Mild atypia, limited margins free.   Dysplastic nevus 08/17/2015   Left LQA. Moderate atypia, lateral margin involved.   Dysplastic nevus 08/17/2015   Right lat. infra pectoral. Mild atypia, deep margin involved and close to lateral margin.   Dysplastic nevus 08/17/2015   Left post. waistline. Mild atypia, deep margin involved.    Dysplastic nevus 09/05/2015   Right mid to sup. calf. Moderate to severe atypia, lateral and deep margins involved. Excised: 09/19/2015, margins free.   Dysplastic nevus 09/05/2015   Left prox. lat. calf near popliteal superior. Mild atypia, close to margin.   Dysplastic nevus 09/05/2015   Left prox. lat. calf near popliteal inferior. Mild atypia, close to margin.   Esophageal reflux    GERD (gastroesophageal reflux disease)    Other and unspecified hyperlipidemia    Positive H. pylori test       Objective: Physical Exam General: The patient is alert and oriented x3 in no acute distress.  Dermatology: Skin is warm, dry and supple bilateral lower extremities. Negative for open lesions or macerations bilateral.   Vascular: Dorsalis Pedis and Posterior Tibial pulses palpable bilateral.  Capillary fill time is immediate to all digits.  Neurological: Grossly intact via light touch Musculoskeletal: Tenderness to palpation to the plantar aspect of the left heel along the plantar fascia. All other joints range of motion within normal limits bilateral. Strength 5/5 in all groups bilateral.   Radiographic exam LT foot 07/11/2023: Normal osseous mineralization. Joint spaces preserved. No fracture/dislocation/boney destruction. No other soft tissue abnormalities or radiopaque foreign bodies.   Assessment: 1. Plantar fasciitis left foot  Plan of Care:  -Patient evaluated. Xrays reviewed.   -Injection of 0.5cc Celestone soluspan injected into the left plantar fascia.  -Rx for Meloxicam ordered for patient. -Continue wearing Dr. Margart Sickles insoles with Shon Baton running shoes -Instructed patient regarding therapies and modalities at home to alleviate symptoms.  -Return to clinic in 4 weeks.     Felecia Shelling, DPM [facility]  Dr. Felecia Shelling, DPM    [address], [place] [address]  Office [phone]  Fax [phone]

## 2023-08-05 ENCOUNTER — Ambulatory Visit: Payer: Medicare HMO | Admitting: Podiatry

## 2023-09-01 ENCOUNTER — Other Ambulatory Visit: Payer: Self-pay | Admitting: Family Medicine

## 2023-09-01 DIAGNOSIS — R1032 Left lower quadrant pain: Secondary | ICD-10-CM

## 2023-09-02 ENCOUNTER — Ambulatory Visit
Admission: RE | Admit: 2023-09-02 | Discharge: 2023-09-02 | Disposition: A | Discharge status: Home / Self Care | Payer: Medicare HMO | Source: Ambulatory Visit | Attending: Family Medicine | Admitting: Family Medicine

## 2023-09-02 DIAGNOSIS — R1032 Left lower quadrant pain: Secondary | ICD-10-CM | POA: Insufficient documentation

## 2023-10-29 ENCOUNTER — Ambulatory Visit: Payer: Medicare Other | Admitting: Urology

## 2023-10-29 VITALS — BP 125/82 | HR 96 | Ht 70.0 in | Wt 215.0 lb

## 2023-10-29 DIAGNOSIS — N50819 Testicular pain, unspecified: Secondary | ICD-10-CM

## 2023-10-29 DIAGNOSIS — N5082 Scrotal pain: Secondary | ICD-10-CM

## 2023-10-29 LAB — MICROSCOPIC EXAMINATION

## 2023-10-29 LAB — URINALYSIS, COMPLETE
Bilirubin, UA: NEGATIVE
Glucose, UA: NEGATIVE
Ketones, UA: NEGATIVE
Leukocytes,UA: NEGATIVE
Nitrite, UA: NEGATIVE
Protein,UA: NEGATIVE
RBC, UA: NEGATIVE
Specific Gravity, UA: 1.025 (ref 1.005–1.030)
Urobilinogen, Ur: 0.2 mg/dL (ref 0.2–1.0)
pH, UA: 6 (ref 5.0–7.5)

## 2023-10-29 NOTE — Progress Notes (Signed)
I, Maysun Anabel Bene, acting as a scribe for Riki Altes, MD., have documented all relevant documentation on the behalf of Riki Altes, MD, as directed by Riki Altes, MD while in the presence of Riki Altes, MD.  10/29/2023 4:15 PM   [name] 1956/06/02 [id number]  Referring provider: Jerl Mina, MD [address] [facility] [place],  [place] [address]  Chief Complaint  Patient presents with   Testicle Pain    HPI: Willie Lopez is a 68 y.o. male referred for evaluation of left scrotal pain.   Seen acute care clinic 08/31/2022 with 1 week history of dull discomfort left hemiscrotum, which he described as a burning sensation and a sensation of increased blood flow in the left hemiscrotum. His urinalysis was unremarkable and a urine culture subsequently negative.  A scrotal ultrasound was ordered which showed normal-appearing testes bilaterally with good flow. There was no evidence of mass or hernia. Tubular ectasia of the right epididymal body was noted. There were small bilateral spermatoceles. He was treated empirically for epididymitis with Levaquin.  He states his symptoms have resolved with the exception of occasional pulling sensation in the left hemisgrotum, which is minimal.  He has no voiding complaints.    PMH: Past Medical History:  Diagnosis Date   Asthma    Dysplastic nevus 03/23/2009   Upper back central. Severe atypia, bordering on early evolving MIS. Excised: 05/19/2009, margins free.    Dysplastic nevus 10/18/2009   Left upper back. Moderate atypia, extends to one edge.    Dysplastic nevus 02/16/2014   Left buttock. Moderate atypia, lateral margin involved. Recurrent: 08/05/2014.    Dysplastic nevus 08/17/2015   Left infrapectoral. Mild atypia, limited margins free.   Dysplastic nevus 08/17/2015   Left LQA. Moderate atypia, lateral margin involved.   Dysplastic nevus 08/17/2015   Right lat. infra pectoral. Mild atypia, deep margin  involved and close to lateral margin.   Dysplastic nevus 08/17/2015   Left post. waistline. Mild atypia, deep margin involved.    Dysplastic nevus 09/05/2015   Right mid to sup. calf. Moderate to severe atypia, lateral and deep margins involved. Excised: 09/19/2015, margins free.   Dysplastic nevus 09/05/2015   Left prox. lat. calf near popliteal superior. Mild atypia, close to margin.   Dysplastic nevus 09/05/2015   Left prox. lat. calf near popliteal inferior. Mild atypia, close to margin.   Esophageal reflux    GERD (gastroesophageal reflux disease)    Other and unspecified hyperlipidemia    Positive H. pylori test     Surgical History: Past Surgical History:  Procedure Laterality Date   KNEE CARTILAGE SURGERY Left    SHOULDER SURGERY     UMBILICAL HERNIA REPAIR      Home Medications:  Allergies as of 10/29/2023       Reactions   Codeine    Sulfa Antibiotics Nausea Only        Medication List        Accurate as of October 29, 2023  4:15 PM. If you have any questions, ask your nurse or doctor.          cyclobenzaprine 5 MG tablet Commonly known as: FLEXERIL Take 5 mg by mouth daily as needed.   losartan 50 MG tablet Commonly known as: COZAAR Take 1 tablet by mouth daily.   meloxicam 15 MG tablet Commonly known as: MOBIC Take 1 tablet (15 mg total) by mouth daily.   omeprazole 20 MG capsule  Commonly known as: PRILOSEC Take 20 mg by mouth daily.        Allergies:  Allergies  Allergen Reactions   Codeine    Sulfa Antibiotics Nausea Only    Family History: Family History  Family history unknown: Yes    Social History:  reports that he has never smoked. He has never used smokeless tobacco. He reports that he does not drink alcohol and does not use drugs.   Physical Exam: BP 125/82   Pulse 96   Ht 5\' 10"  (1.778 m)   Wt 215 lb (97.5 kg)   BMI 30.85 kg/m   Constitutional:  Alert and oriented, No acute distress. HEENT: [place] AT Respiratory:  Normal respiratory effort, no increased work of breathing. GU: Phallus without lesions. Testes descended bilateral without masses or tenderness. Spermatic cord palpably normal bilateral. No palpable spermatoceles  No evidence of inguinal hernia.  Psychiatric: Normal mood and affect.  Urinalysis Dipstick/microscopy negative.  Pertinent imaging  Ultrasound was personally reviewed and interpreted.   Ultrasound  TECHNIQUE: Two-dimensional grayscale, color and spectral Doppler ultrasound of the scrotum and testicles was performed.   FINDINGS: The right testicle measures 3.7 x 2.4 x 3.0 cm, demonstrates a normal echotexture with normal color and spectral Doppler flow. There is a 0.2 cm cluster of intratesticular calcifications within the inferior pole. There are no intratesticular masses. The right epididymal head measures 1.3 cm and demonstrates a normal echotexture with a 0.3 cm anechoic cyst. The left testicular appendage is visualized and demonstrates a 0.4 cm anechoic cyst. There is tubular ectasia of the right epididymal body.   The left testicle measures 3.8 x 2.4 x 3.0 cm, demonstrates a normal echotexture with normal color and spectral Doppler flow. There are no intratesticular masses or calcifications. The left epididymal head measures 1.2 cm and demonstrates a normal echotexture with multiple cysts, largest measures 0.4 cm and appears to be two adjacent cysts measured as one. The left epididymal appendage is visualized and appears unremarkable.   The right scrotal calcification is present measuring 0.3 cm.   There are three subcentimeter soft tissue appearing lesions with small calcifications identified along inferior left scrotal wall.   There are small bilateral hydroceles, right side contains debris. A left-sided varicocele is present.   No sonographic abnormalities are identified within the bilateral groin.   IMPRESSION: 1. Subcentimeter cluster of right  intratesticular calcification. No sonographic evidence of mass lesion.   2.  Bilateral epididymal head simple cysts.   3.  Subcentimeter left testicular appendage simple cyst.   4.  Tubular ectasia of the right epididymal body.   5. Three left scrotal wall subcentimeter soft tissue appearing lesions containing calcification.   6.  Small bilateral hydroceles, right with debris.   7.  Left varicocele.   8.  No sonographic evidence of bilateral inguinal hernia.   Thank you for allowing [place] to assist in the care of this patient.     Electronically Signed   By: Lestine Box M.D.   On: 09/03/2023 08:01    Assessment & Plan:    1. Left scrotal content pain Etiology undetermined No significant abnormalities on scrotal ultrasound.  Exam unremarkable He is minimally symptomatic at present and instructured to call for recurrent symptoms for re-exam.  I have reviewed the above documentation for accuracy and completeness, and I agree with the above.   Riki Altes, MD  [facility] [address], [address] [place], [place] [address] [phone]

## 2023-10-31 ENCOUNTER — Encounter: Payer: Self-pay | Admitting: Urology

## 2023-12-10 ENCOUNTER — Ambulatory Visit: Payer: Medicare Other | Admitting: Orthopedic Surgery

## 2023-12-26 ENCOUNTER — Encounter: Payer: Self-pay | Admitting: Podiatry

## 2023-12-26 ENCOUNTER — Ambulatory Visit: Admitting: Podiatry

## 2023-12-26 DIAGNOSIS — M722 Plantar fascial fibromatosis: Secondary | ICD-10-CM | POA: Diagnosis not present

## 2023-12-26 MED ORDER — MELOXICAM 15 MG PO TABS: 15.0000 mg | ORAL_TABLET | Freq: Every day | ORAL | 1 refills | Status: AC

## 2023-12-26 NOTE — Progress Notes (Signed)
   Chief Complaint  Patient presents with   Foot Pain    Left heel pain, HX of PF and capsulitis would like to discuss options or another possible injection    Subjective: 68 y.o. male presenting today for follow-up evaluation of plantar fasciitis to the left foot.  He says the injections do help for several months however the pain slowly returns.  He is looking for different solutions.  He currently wears good supportive tennis shoes and sneakers.  Past Medical History:  Diagnosis Date   Asthma    Dysplastic nevus 03/23/2009   Upper back central. Severe atypia, bordering on early evolving MIS. Excised: 05/19/2009, margins free.    Dysplastic nevus 10/18/2009   Left upper back. Moderate atypia, extends to one edge.    Dysplastic nevus 02/16/2014   Left buttock. Moderate atypia, lateral margin involved. Recurrent: 08/05/2014.    Dysplastic nevus 08/17/2015   Left infrapectoral. Mild atypia, limited margins free.   Dysplastic nevus 08/17/2015   Left LQA. Moderate atypia, lateral margin involved.   Dysplastic nevus 08/17/2015   Right lat. infra pectoral. Mild atypia, deep margin involved and close to lateral margin.   Dysplastic nevus 08/17/2015   Left post. waistline. Mild atypia, deep margin involved.    Dysplastic nevus 09/05/2015   Right mid to sup. calf. Moderate to severe atypia, lateral and deep margins involved. Excised: 09/19/2015, margins free.   Dysplastic nevus 09/05/2015   Left prox. lat. calf near popliteal superior. Mild atypia, close to margin.   Dysplastic nevus 09/05/2015   Left prox. lat. calf near popliteal inferior. Mild atypia, close to margin.   Esophageal reflux    GERD (gastroesophageal reflux disease)    Other and unspecified hyperlipidemia    Positive H. pylori test      Objective: Physical Exam General: The patient is alert and oriented x3 in no acute distress.  Dermatology: Skin is warm, dry and supple bilateral lower extremities. Negative for open  lesions or macerations bilateral.   Vascular: Dorsalis Pedis and Posterior Tibial pulses palpable bilateral.  Capillary fill time is immediate to all digits.  Neurological: Grossly intact via light touch  Musculoskeletal: Tenderness to palpation to the plantar aspect of the left heel along the plantar fascia. All other joints range of motion within normal limits bilateral. Strength 5/5 in all groups bilateral.   Radiographic exam LT foot 07/11/2023: Normal osseous mineralization. Joint spaces preserved. No fracture/dislocation/boney destruction. No other soft tissue abnormalities or radiopaque foreign bodies.   Assessment: 1.  Recurrent plantar fasciitis left foot  Plan of Care:  -Patient evaluated.  -Declined cortisone injection today -Prescription for meloxicam 15 mg daily as needed -Continue wearing good supportive shoes and sneakers -Return to clinic as needed   Felecia Shelling, DPM [facility]  Dr. Felecia Shelling, DPM    [address] [place], [place] [address]                Office [phone]  Fax [phone]

## 2024-01-05 ENCOUNTER — Other Ambulatory Visit: Payer: Self-pay | Admitting: Orthopedic Surgery

## 2024-01-05 DIAGNOSIS — M5126 Other intervertebral disc displacement, lumbar region: Secondary | ICD-10-CM

## 2024-01-09 ENCOUNTER — Other Ambulatory Visit

## 2024-01-11 ENCOUNTER — Ambulatory Visit
Admission: RE | Admit: 2024-01-11 | Discharge: 2024-01-11 | Disposition: A | Discharge status: Home / Self Care | Source: Ambulatory Visit | Attending: Orthopedic Surgery | Admitting: Orthopedic Surgery

## 2024-01-11 DIAGNOSIS — M5126 Other intervertebral disc displacement, lumbar region: Secondary | ICD-10-CM

## 2024-02-03 ENCOUNTER — Encounter: Payer: Self-pay | Admitting: Podiatry

## 2024-02-03 ENCOUNTER — Ambulatory Visit: Admitting: Podiatry

## 2024-02-03 DIAGNOSIS — M722 Plantar fascial fibromatosis: Secondary | ICD-10-CM | POA: Diagnosis not present

## 2024-02-03 MED ORDER — BETAMETHASONE SOD PHOS & ACET 6 (3-3) MG/ML IJ SUSP
3.0000 mg | Freq: Once | INTRAMUSCULAR | Status: AC
  Administered 2024-02-03: 3 mg via INTRA_ARTICULAR

## 2024-02-03 NOTE — Progress Notes (Signed)
   Chief Complaint  Patient presents with   Plantar Fasciitis    "I been on this medication but it hasn't taken the pain away.  It's been bothering me."    Subjective: 68 y.o. male presenting today for follow-up evaluation of plantar fasciitis to the left foot.  Patient continues to have heel pain.  Meloxicam  only helped minimally.  Past Medical History:  Diagnosis Date   Asthma    Dysplastic nevus 03/23/2009   Upper back central. Severe atypia, bordering on early evolving MIS. Excised: 05/19/2009, margins free.    Dysplastic nevus 10/18/2009   Left upper back. Moderate atypia, extends to one edge.    Dysplastic nevus 02/16/2014   Left buttock. Moderate atypia, lateral margin involved. Recurrent: 08/05/2014.    Dysplastic nevus 08/17/2015   Left infrapectoral. Mild atypia, limited margins free.   Dysplastic nevus 08/17/2015   Left LQA. Moderate atypia, lateral margin involved.   Dysplastic nevus 08/17/2015   Right lat. infra pectoral. Mild atypia, deep margin involved and close to lateral margin.   Dysplastic nevus 08/17/2015   Left post. waistline. Mild atypia, deep margin involved.    Dysplastic nevus 09/05/2015   Right mid to sup. calf. Moderate to severe atypia, lateral and deep margins involved. Excised: 09/19/2015, margins free.   Dysplastic nevus 09/05/2015   Left prox. lat. calf near popliteal superior. Mild atypia, close to margin.   Dysplastic nevus 09/05/2015   Left prox. lat. calf near popliteal inferior. Mild atypia, close to margin.   Esophageal reflux    GERD (gastroesophageal reflux disease)    Other and unspecified hyperlipidemia    Positive H. pylori test      Objective: Physical Exam General: The patient is alert and oriented x3 in no acute distress.  Dermatology: Skin is warm, dry and supple bilateral lower extremities. Negative for open lesions or macerations bilateral.   Vascular: Dorsalis Pedis and Posterior Tibial pulses palpable bilateral.   Capillary fill time is immediate to all digits.  Neurological: Grossly intact via light touch  Musculoskeletal: Tenderness to palpation to the plantar aspect of the left heel along the plantar fascia. All other joints range of motion within normal limits bilateral. Strength 5/5 in all groups bilateral.   Radiographic exam LT foot 07/11/2023: Normal osseous mineralization. Joint spaces preserved. No fracture/dislocation/boney destruction. No other soft tissue abnormalities or radiopaque foreign bodies.   Assessment: 1.  Recurrent plantar fasciitis left foot  Plan of Care:  -Patient evaluated.  - Injection of 0.5 cc Celestone  Soluspan injected in the plantar fascia left -Continue meloxicam  15 mg daily as needed -OTC prefabricated power step insoles dispensed.  Wear daily -Return to clinic 4 weeks  Dot Gazella, DPM [facility]  Dr. Dot Gazella, DPM    [address] [place], [place] [address]                Office [phone]  Fax [phone]

## 2024-02-05 ENCOUNTER — Encounter: Payer: Self-pay | Admitting: Dermatology

## 2024-02-05 ENCOUNTER — Ambulatory Visit: Payer: Medicare HMO | Admitting: Dermatology

## 2024-02-05 DIAGNOSIS — Z79899 Other long term (current) drug therapy: Secondary | ICD-10-CM

## 2024-02-05 DIAGNOSIS — Z1283 Encounter for screening for malignant neoplasm of skin: Secondary | ICD-10-CM | POA: Diagnosis not present

## 2024-02-05 DIAGNOSIS — L578 Other skin changes due to chronic exposure to nonionizing radiation: Secondary | ICD-10-CM | POA: Diagnosis not present

## 2024-02-05 DIAGNOSIS — L820 Inflamed seborrheic keratosis: Secondary | ICD-10-CM | POA: Diagnosis not present

## 2024-02-05 DIAGNOSIS — W908XXA Exposure to other nonionizing radiation, initial encounter: Secondary | ICD-10-CM | POA: Diagnosis not present

## 2024-02-05 DIAGNOSIS — L738 Other specified follicular disorders: Secondary | ICD-10-CM

## 2024-02-05 DIAGNOSIS — D1801 Hemangioma of skin and subcutaneous tissue: Secondary | ICD-10-CM

## 2024-02-05 DIAGNOSIS — Z7189 Other specified counseling: Secondary | ICD-10-CM

## 2024-02-05 DIAGNOSIS — D239 Other benign neoplasm of skin, unspecified: Secondary | ICD-10-CM

## 2024-02-05 DIAGNOSIS — L814 Other melanin hyperpigmentation: Secondary | ICD-10-CM

## 2024-02-05 DIAGNOSIS — D229 Melanocytic nevi, unspecified: Secondary | ICD-10-CM

## 2024-02-05 DIAGNOSIS — L711 Rhinophyma: Secondary | ICD-10-CM

## 2024-02-05 DIAGNOSIS — L719 Rosacea, unspecified: Secondary | ICD-10-CM

## 2024-02-05 DIAGNOSIS — L821 Other seborrheic keratosis: Secondary | ICD-10-CM

## 2024-02-05 DIAGNOSIS — Z86018 Personal history of other benign neoplasm: Secondary | ICD-10-CM

## 2024-02-05 DIAGNOSIS — D2371 Other benign neoplasm of skin of right lower limb, including hip: Secondary | ICD-10-CM

## 2024-02-05 NOTE — Progress Notes (Signed)
 Follow-Up Visit   Subjective  Willie Lopez is a 68 y.o. male who presents for the following: Skin Cancer Screening and Full Body Skin Exam  The patient presents for Total-Body Skin Exam (TBSE) for skin cancer screening and mole check. The patient has spots, moles and lesions to be evaluated, some may be new or changing and the patient may have concern these could be cancer.  Hx DN. Patient with some bumps at face. HX of rosacea, using SM triple cream, controlled.   The following portions of the chart were reviewed this encounter and updated as appropriate: medications, allergies, medical history  Review of Systems:  No other skin or systemic complaints except as noted in HPI or Assessment and Plan.  Objective  Well appearing patient in no apparent distress; mood and affect are within normal limits.  A full examination was performed including scalp, head, eyes, ears, nose, lips, neck, chest, axillae, abdomen, back, buttocks, bilateral upper extremities, bilateral lower extremities, hands, feet, fingers, toes, fingernails, and toenails. All findings within normal limits unless otherwise noted below.   Relevant physical exam findings are noted in the Assessment and Plan.  face (6) Small yellow papules with a central dell.  bilateral hands x 25 (25) Erythematous stuck-on, waxy papule or plaque  Assessment & Plan   SKIN CANCER SCREENING PERFORMED TODAY.  ACTINIC DAMAGE - Chronic condition, secondary to cumulative UV/sun exposure - diffuse scaly erythematous macules with underlying dyspigmentation - Recommend daily broad spectrum sunscreen SPF 30+ to sun-exposed areas, reapply every 2 hours as needed.  - Staying in the shade or wearing long sleeves, sun glasses (UVA+UVB protection) and wide brim hats (4-inch brim around the entire circumference of the hat) are also recommended for sun protection.  - Call for new or changing lesions.  LENTIGINES, SEBORRHEIC KERATOSES, HEMANGIOMAS -  Benign normal skin lesions - Benign-appearing - Call for any changes  MELANOCYTIC NEVI - Tan-brown and/or pink-flesh-colored symmetric macules and papules - Benign appearing on exam today - Observation - Call clinic for new or changing moles - Recommend daily use of broad spectrum spf 30+ sunscreen to sun-exposed areas.   History of Dysplastic Nevi - No evidence of recurrence today - Recommend regular full body skin exams - Recommend daily broad spectrum sunscreen SPF 30+ to sun-exposed areas, reapply every 2 hours as needed.  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 It is treatable but not curable. It sometimes affects the eyes (ocular rosacea) as well. It may respond to topical and/or systemic medication and can flare with stress, sun exposure, alcohol, exercise and some foods.  Daily application of broad spectrum spf 30+ sunscreen to face is recommended to reduce flares.   Discussed the treatment option of BBL/laser.  Typically we recommend 1-3 treatment sessions about 5-8 weeks apart for best results.  The patient's condition may require "maintenance treatments" in the future.  The fee for BBL / laser treatments is $350 per treatment session for the whole face.  A fee can be quoted for other parts of the body. Insurance typically does not pay for BBL/laser treatments and therefore the fee is an out-of-pocket cost.   Cont [facility] metronidazole/ivermectin/azelaic acid twice daily as needed to affected areas on the face. The patient was  advised this is not covered by insurance since it is made by a Set designer. They will receive an email to check out and the medication will be mailed to their home.    Discussed adding  doxycycline. Patient declines oral doxycyccline   Instructions for [facility] Medications One or more of your medications was sent to the [facility] mail order compounding pharmacy. You will receive an email from them and can purchase the medicine through that link. It will then be mailed to your home at the address you confirmed. If for any reason you do not receive an email from them, please check your spam folder. If you still do not find the email, please let us  know. [facility] phone number is [phone].  DERMATOFIBROMA Exam: Firm pink/brown papulenodule with dimple sign at right medial pretibial. Treatment Plan: A dermatofibroma is a benign growth possibly related to trauma, such as an insect bite, cut from shaving, or inflamed acne-type bump.  Treatment options to remove include shave or excision with resulting scar and risk of recurrence.  Since benign-appearing and not bothersome, will observe for now.   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  Benign-appearing.  Call clinic for new or changing lesions.   Destruction of lesion - bilateral hands x 25 (25) Complexity: simple   Destruction method: cryotherapy   Informed consent: discussed and consent obtained   Timeout:  patient name, date of birth, surgical site, and procedure verified Lesion destroyed using liquid nitrogen: Yes   Region frozen until ice ball extended beyond lesion: Yes   Outcome: patient tolerated procedure well with no complications   Post-procedure details: wound care instructions  given   SKIN CANCER SCREENING   ACTINIC SKIN DAMAGE   LENTIGO   MELANOCYTIC NEVUS, UNSPECIFIED LOCATION   SEBORRHEIC KERATOSIS   COUNSELING AND COORDINATION OF CARE   MEDICATION MANAGEMENT   ROSACEA   HISTORY OF DYSPLASTIC NEVUS   RHINOPHYMA   DERMATOFIBROMA   Return in about 1 year (around 02/04/2025) for TBSE, with Dr. Linnell Richardson, HxDN, Rosacea.  Kerstin Peeling, RMA, am acting as scribe for Celine Collard, MD .   Documentation: I have reviewed the above documentation for accuracy and completeness, and I agree with the above.  Celine Collard, MD

## 2024-02-05 NOTE — Patient Instructions (Signed)

## 2024-03-23 ENCOUNTER — Ambulatory Visit: Admitting: Podiatry

## 2024-08-02 ENCOUNTER — Encounter: Payer: Self-pay | Admitting: Radiology

## 2025-02-10 ENCOUNTER — Ambulatory Visit: Admitting: Dermatology
